# Patient Record
Sex: Female | Born: 2000 | Race: White | Hispanic: No | Marital: Single | Smoking: Never smoker
Health system: Southern US, Community
[De-identification: ages and names within clinical notes are randomized; demographics above are authoritative.]

---
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
## 2022-08-12 ENCOUNTER — Ambulatory Visit: Payer: PRIVATE HEALTH INSURANCE

## 2022-08-12 DIAGNOSIS — Z23 Encounter for immunization: Secondary | ICD-10-CM
